# Patient Record
Sex: Male | Born: 1970 | Race: White | Hispanic: No | Marital: Married | Smoking: Former smoker
Health system: Southern US, Community
[De-identification: ages and names within clinical notes are randomized; demographics above are authoritative.]

## PROBLEM LIST (undated history)

## (undated) DIAGNOSIS — F410 Panic disorder [episodic paroxysmal anxiety] without agoraphobia: Secondary | ICD-10-CM

## (undated) DIAGNOSIS — K219 Gastro-esophageal reflux disease without esophagitis: Secondary | ICD-10-CM

## (undated) DIAGNOSIS — E669 Obesity, unspecified: Secondary | ICD-10-CM

## (undated) HISTORY — PX: HAND SURGERY: SHX662

## (undated) HISTORY — PX: NASAL SINUS SURGERY: SHX719

---

## 2000-03-13 ENCOUNTER — Encounter: Payer: Self-pay | Admitting: Otolaryngology

## 2000-03-13 ENCOUNTER — Encounter: Admission: RE | Admit: 2000-03-13 | Discharge: 2000-03-13 | Payer: Self-pay | Admitting: Otolaryngology

## 2000-04-13 ENCOUNTER — Other Ambulatory Visit: Admission: RE | Admit: 2000-04-13 | Discharge: 2000-04-13 | Payer: Self-pay | Admitting: Otolaryngology

## 2009-03-12 ENCOUNTER — Encounter: Admission: RE | Admit: 2009-03-12 | Discharge: 2009-03-12 | Payer: Self-pay | Admitting: Gastroenterology

## 2010-12-26 ENCOUNTER — Encounter: Payer: Self-pay | Admitting: Emergency Medicine

## 2010-12-26 ENCOUNTER — Emergency Department (HOSPITAL_BASED_OUTPATIENT_CLINIC_OR_DEPARTMENT_OTHER)
Admission: EM | Admit: 2010-12-26 | Discharge: 2010-12-26 | Disposition: A | Discharge status: Home / Self Care | Payer: BC Managed Care – PPO | Attending: Emergency Medicine | Admitting: Emergency Medicine

## 2010-12-26 DIAGNOSIS — M25579 Pain in unspecified ankle and joints of unspecified foot: Secondary | ICD-10-CM | POA: Insufficient documentation

## 2010-12-26 DIAGNOSIS — M109 Gout, unspecified: Secondary | ICD-10-CM | POA: Insufficient documentation

## 2010-12-26 HISTORY — DX: Gastro-esophageal reflux disease without esophagitis: K21.9

## 2010-12-26 MED ORDER — PREDNISONE 10 MG PO TABS: 20.0000 mg | ORAL_TABLET | Freq: Every day | ORAL | Status: AC

## 2010-12-26 MED ORDER — OXYCODONE-ACETAMINOPHEN 5-325 MG PO TABS: 1.0000 | ORAL_TABLET | ORAL | Status: AC | PRN

## 2010-12-26 NOTE — ED Notes (Signed)
Pt reports severe flare up of gout symptoms

## 2010-12-26 NOTE — ED Provider Notes (Signed)
History     CSN: [id number] Arrival date & time: No admission date for patient encounter.   First MD Initiated Contact with Patient 12/26/10 0710      No chief complaint on file.   (Consider location/radiation/quality/duration/timing/severity/associated sxs/prior treatment) HPI Patient with history of gout and has had three episodes on the right great toe.  Patient seen by pmd and had uric acid and cochicine f/b allopurinol.  Patient states every time he stops meds symptoms return.  Last prednisone about one month ago.  Symptoms began this time last night at 1030 and complains of severe pain.  Pain stays in right great toe area with redness took hydrocodone without relief.   No past medical history on file.  gout  No past surgical history on file.  No family history on file.  History  Substance Use Topics  . Smoking status: Not on file  . Smokeless tobacco: Not on file  . Alcohol Use: Not on file      Review of Systems  All other systems reviewed and are negative.    Allergies  Review of patient's allergies indicates not on file.  Home Medications  No current outpatient prescriptions on file.  There were no vitals taken for this visit.  Physical Exam  Nursing note and vitals reviewed. Constitutional: He is oriented to person, place, and time. He appears well-developed and well-nourished.  HENT:  Head: Normocephalic and atraumatic.  Eyes: Conjunctivae are normal. Pupils are equal, round, and reactive to light.  Neck: Normal range of motion. Neck supple.  Cardiovascular: Normal rate and regular rhythm.   Pulmonary/Chest: Effort normal and breath sounds normal.  Abdominal: Soft. Bowel sounds are normal.  Musculoskeletal: Normal range of motion.       Right great toe with pain and swelling redness tenderness  Neurological: He is alert and oriented to person, place, and time. He has normal reflexes.  Skin: Skin is warm and dry.  Psychiatric: He has a normal mood  and affect.    ED Course  Procedures (including critical care time)  Labs Reviewed - No data to display No results found.   No diagnosis found.            Hilario Quarry, MD 12/26/10 [phone]

## 2012-05-29 ENCOUNTER — Encounter (HOSPITAL_BASED_OUTPATIENT_CLINIC_OR_DEPARTMENT_OTHER): Payer: Self-pay

## 2012-05-29 ENCOUNTER — Emergency Department (HOSPITAL_BASED_OUTPATIENT_CLINIC_OR_DEPARTMENT_OTHER)
Admission: EM | Admit: 2012-05-29 | Discharge: 2012-05-29 | Disposition: A | Discharge status: Home / Self Care | Payer: BC Managed Care – PPO | Attending: Emergency Medicine | Admitting: Emergency Medicine

## 2012-05-29 ENCOUNTER — Emergency Department (HOSPITAL_BASED_OUTPATIENT_CLINIC_OR_DEPARTMENT_OTHER): Payer: BC Managed Care – PPO

## 2012-05-29 DIAGNOSIS — Z79899 Other long term (current) drug therapy: Secondary | ICD-10-CM | POA: Insufficient documentation

## 2012-05-29 DIAGNOSIS — R51 Headache: Secondary | ICD-10-CM | POA: Insufficient documentation

## 2012-05-29 DIAGNOSIS — E669 Obesity, unspecified: Secondary | ICD-10-CM | POA: Insufficient documentation

## 2012-05-29 DIAGNOSIS — R5383 Other fatigue: Secondary | ICD-10-CM | POA: Insufficient documentation

## 2012-05-29 DIAGNOSIS — R5381 Other malaise: Secondary | ICD-10-CM | POA: Insufficient documentation

## 2012-05-29 DIAGNOSIS — Z8719 Personal history of other diseases of the digestive system: Secondary | ICD-10-CM | POA: Insufficient documentation

## 2012-05-29 DIAGNOSIS — R202 Paresthesia of skin: Secondary | ICD-10-CM

## 2012-05-29 DIAGNOSIS — R209 Unspecified disturbances of skin sensation: Secondary | ICD-10-CM | POA: Insufficient documentation

## 2012-05-29 DIAGNOSIS — M109 Gout, unspecified: Secondary | ICD-10-CM | POA: Insufficient documentation

## 2012-05-29 DIAGNOSIS — Z87891 Personal history of nicotine dependence: Secondary | ICD-10-CM | POA: Insufficient documentation

## 2012-05-29 HISTORY — DX: Obesity, unspecified: E66.9

## 2012-05-29 LAB — COMPREHENSIVE METABOLIC PANEL
AST: 20 U/L (ref 0–37)
Albumin: 4.1 g/dL (ref 3.5–5.2)
BUN: 12 mg/dL (ref 6–23)
Calcium: 9.4 mg/dL (ref 8.4–10.5)
Creatinine, Ser: 1 mg/dL (ref 0.50–1.35)
Total Protein: 6.4 g/dL (ref 6.0–8.3)

## 2012-05-29 LAB — CBC WITH DIFFERENTIAL/PLATELET
Basophils Absolute: 0 10*3/uL (ref 0.0–0.1)
Basophils Relative: 0 % (ref 0–1)
Eosinophils Absolute: 0.3 10*3/uL (ref 0.0–0.7)
HCT: 41.4 % (ref 39.0–52.0)
Hemoglobin: 14.9 g/dL (ref 13.0–17.0)
MCH: 31.6 pg (ref 26.0–34.0)
MCHC: 36 g/dL (ref 30.0–36.0)
Monocytes Absolute: 0.6 10*3/uL (ref 0.1–1.0)
Monocytes Relative: 8 % (ref 3–12)
RDW: 13.3 % (ref 11.5–15.5)

## 2012-05-29 LAB — GLUCOSE, CAPILLARY: Glucose-Capillary: 118 mg/dL — ABNORMAL HIGH (ref 70–99)

## 2012-05-29 LAB — URINALYSIS, ROUTINE W REFLEX MICROSCOPIC
Ketones, ur: NEGATIVE mg/dL
Leukocytes, UA: NEGATIVE
Nitrite: NEGATIVE
Specific Gravity, Urine: 1.009 (ref 1.005–1.030)
pH: 6 (ref 5.0–8.0)

## 2012-05-29 LAB — TROPONIN I: Troponin I: <0.3 ng/mL (ref <0.30)

## 2012-05-29 NOTE — ED Notes (Signed)
Pt states that he began to experience dizziness today while at work about 1630, and a few minutes later he had weakness in the legs.  Pt states that his L arm feels very weak but otherwise symptoms have resolved.  Grips equal, perrla, neuro wnl.

## 2012-05-29 NOTE — ED Provider Notes (Signed)
History  This chart was scribed for Glynn Octave, MD by Bennett Scrape, ED Scribe. This patient was seen in room MH01/MH01 and the patient's care was started at 6:00 PM.   CSN: [id number]  Arrival date & time 05/29/12  1745   First MD Initiated Contact with Patient 05/29/12 1800      Chief Complaint  Patient presents with  . Dizziness    The history is provided by the patient. No language interpreter was used.    Derek Graham is a 42 y.o. male who presents to the Emergency Department complaining of sudden onset, now resolved dizziness with associated 10 minutes of "pins and needles" tingling and weakness in his left hand that radiated up to his left elbow that started while at work 2 hours ago. He states that he feels weak currently and has a mild HA but states that the dizziness and the tingling are completely resolved. He states that he left work and went to an [employer], walked in and felt dizzy. When he walked back to his truck and sat down, he started having the tingling sensation. He reports that he has been under more stress than normal and contributes one episode of tingling last week that resolved on its own to anxiety.  He states that he has a h/o vertigo for which he takes meclizine as needed but denies similarities. He denies CP, SOB, nausea, and emesis as associated symptoms. He denies having a h/o HTN and DM. He has a h/o GERD and gout and is a former smoker.  PCP is Dr. Catha Gosselin  Past Medical History  Diagnosis Date  . Gout   . GERD (gastroesophageal reflux disease)   . Obesity     Past Surgical History  Procedure Laterality Date  . Nasal sinus surgery    . Hand surgery      History reviewed. No pertinent family history.  History  Substance Use Topics  . Smoking status: Former Games developer  . Smokeless tobacco: Not on file  . Alcohol Use: No     Comment: once a month      Review of Systems  A complete 10 system review of systems was obtained  and all systems are negative except as noted in the HPI and PMH.   Allergies  Motrin  Home Medications   Current Outpatient Rx  Name  Route  Sig  Dispense  Refill  . allopurinol (ZYLOPRIM) 100 MG tablet   Oral   Take 100 mg by mouth daily.           . colchicine 0.6 MG tablet   Oral   Take 0.6 mg by mouth daily.             Triage Vitals: BP 147/94  Pulse 77  Temp(Src) 98 F (36.7 C) (Oral)  Resp 16  Ht 6' (1.829 m)  Wt 345 lb (156.491 kg)  BMI 46.78 kg/m2  SpO2 98%  Physical Exam  Nursing note and vitals reviewed. Constitutional: He is oriented to person, place, and time. He appears well-developed and well-nourished. No distress.  HENT:  Head: Normocephalic and atraumatic.  Mouth/Throat: Oropharynx is clear and moist.  Eyes: Conjunctivae and EOM are normal. Pupils are equal, round, and reactive to light.  Visual fields full to confrontation   Neck: Neck supple. No tracheal deviation present.  Cardiovascular: Normal rate and regular rhythm.   No murmur heard. Pulmonary/Chest: Effort normal and breath sounds normal. No respiratory distress.  Abdominal: Soft. There  is no tenderness.  Musculoskeletal: Normal range of motion.  Neurological: He is alert and oriented to person, place, and time.  No ataxia on finger to nose, 5/5 strength throughout, no pronator drift. CN 2-12 intact. Negative Romberg.  Skin: Skin is warm and dry.  Psychiatric: He has a normal mood and affect. His behavior is normal.    ED Course  Procedures (including critical care time)  DIAGNOSTIC STUDIES: Oxygen Saturation is 98% on room air, normal by my interpretation.    COORDINATION OF CARE: 6:11 PM-Discussed treatment plan which includes CT of head, CBC panel, CMP, UA and troponin with pt at bedside and pt agreed to plan.   7:28 PM-Informed pt of radiology and lab work results. Discussed discharge plan with pt and pt agreed to plan. Also advised pt to follow up as needed and pt  agreed.  Labs Reviewed  GLUCOSE, CAPILLARY - Abnormal; Notable for the following:    Glucose-Capillary 118 (*)    All other components within normal limits  COMPREHENSIVE METABOLIC PANEL - Abnormal; Notable for the following:    Potassium 3.4 (*)    Glucose, Bld 108 (*)    All other components within normal limits  CBC WITH DIFFERENTIAL  TROPONIN I  URINALYSIS, ROUTINE W REFLEX MICROSCOPIC   Ct Head Wo Contrast  05/29/2012  *RADIOLOGY REPORT*  Clinical Data: Dizziness.  Frontal headache  CT HEAD WITHOUT CONTRAST  Technique:  Contiguous axial images were obtained from the base of the skull through the vertex without contrast.  Comparison: None  Findings: The brain has a normal appearance without evidence for hemorrhage, infarction, hydrocephalus, or mass lesion.  There is no extra axial fluid collection.  Retention cyst or polyps noted within the right maxillary sinus.  The remaining paranasal sinuses appear clear.  The mastoid air cells are clear.  The skull is intact.  IMPRESSION: 1.  Normal appearance of the brain. 2.  Retention cyst versus polyp within the right maxillary sinus.   Original Report Authenticated By: Signa Kell, M.D.      No diagnosis found.    MDM  Patient presents with episode of left arm tingling associated with dizziness and generalized weakness that lasted about 10 minutes and is now resolved. Had similar episode last week of bilateral upper extremity numbness. Denies any focal weakness, difficulty breathing or swallowing. No difficulty talking. feels back to baseline now other than being generally weak.  ABCD 2 score is 1 based on blood pressure. Do not suspect TIA or CVA  Patient feels improved. He is much more calm. CT scan is negative. Laboratory remarkable. Denies any dizziness, lightheadedness, paresthesias or weakness.  Discussed with the patient that he is very low risk for CVA or TIA. ABCD 2 score is 1 because patient was hypertensive on arrival which  may be which may have been from anxiety. Suspect component of anxiety contributing to paresthesias.Believe he is safe for outpatient followup. Doubt TIA or CVA.    Date: 05/29/2012  Rate: 73  Rhythm: normal sinus rhythm  QRS Axis: normal  Intervals: normal  ST/T Wave abnormalities: normal  Conduction Disutrbances:nonspecific intraventricular conduction delay  Narrative Interpretation:   Old EKG Reviewed: none available   I personally performed the services described in this documentation, which was scribed in my presence. The recorded information has been reviewed and is accurate.         Glynn Octave, MD 05/30/12 1155

## 2012-05-29 NOTE — ED Notes (Signed)
MD at bedside. 

## 2012-05-30 ENCOUNTER — Emergency Department (HOSPITAL_COMMUNITY)
Admission: EM | Admit: 2012-05-30 | Discharge: 2012-05-30 | Disposition: A | Discharge status: Home / Self Care | Payer: BC Managed Care – PPO | Attending: Emergency Medicine | Admitting: Emergency Medicine

## 2012-05-30 ENCOUNTER — Encounter (HOSPITAL_COMMUNITY): Payer: Self-pay | Admitting: Neurology

## 2012-05-30 DIAGNOSIS — M109 Gout, unspecified: Secondary | ICD-10-CM | POA: Insufficient documentation

## 2012-05-30 DIAGNOSIS — Z87891 Personal history of nicotine dependence: Secondary | ICD-10-CM | POA: Insufficient documentation

## 2012-05-30 DIAGNOSIS — F419 Anxiety disorder, unspecified: Secondary | ICD-10-CM

## 2012-05-30 DIAGNOSIS — Z79899 Other long term (current) drug therapy: Secondary | ICD-10-CM | POA: Insufficient documentation

## 2012-05-30 DIAGNOSIS — R110 Nausea: Secondary | ICD-10-CM | POA: Insufficient documentation

## 2012-05-30 DIAGNOSIS — E669 Obesity, unspecified: Secondary | ICD-10-CM | POA: Insufficient documentation

## 2012-05-30 DIAGNOSIS — K219 Gastro-esophageal reflux disease without esophagitis: Secondary | ICD-10-CM | POA: Insufficient documentation

## 2012-05-30 DIAGNOSIS — F411 Generalized anxiety disorder: Secondary | ICD-10-CM | POA: Insufficient documentation

## 2012-05-30 MED ORDER — POTASSIUM CHLORIDE CRYS ER 20 MEQ PO TBCR
40.0000 meq | EXTENDED_RELEASE_TABLET | Freq: Once | ORAL | Status: AC
  Administered 2012-05-30: 40 meq via ORAL
  Filled 2012-05-30: qty 2

## 2012-05-30 NOTE — ED Provider Notes (Signed)
Complains of left hand numbness onset yesterday afternoon which initially felt like "an electric shock" at his left elbow and then he developed numbness in his left hand. He also reports heaviness in both legs for the past 2 weeks, and numbness around his lips. 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 He was seen at [facility] yesterday had lab work and head CT performed remarkable for potassium 3.4 otherwise normal on exam alert Glasgow Coma Score 15 heart regular rate and rhythm lungs clear auscultation abdomen obese nontender all 4 extremities without redness or tenderness neurovascular intact neurologic Glasgow Coma Score 15 gait normal Romberg normal pronator drift normal motor strength 5 over 5 overall DTRs symmetric bilaterally knee jerk ankle jerk biceps patellar bilaterally finger to nose normal heel to shin normal I discussed with patient at length I feel that his symptoms are largely due to anxiety. He is in agreement.  Doug Sou, MD 05/30/12 [phone]

## 2012-05-30 NOTE — ED Notes (Addendum)
Pt reporting heaviness in arms and legs. This has been going on for several weeks, is intermittent and has tingling at times. Reporting significant stress in his life right now. Was seen at [facility] last night, discharged. Last night nausea. Speech is clear, following all commands. A x 4. Reports h/a, dizziness that has been intermittent

## 2012-05-30 NOTE — ED Provider Notes (Signed)
Medical screening examination/treatment/procedure(s) were conducted as a shared visit with non-physician practitioner(s) and myself.  I personally evaluated the patient during the encounter  Latreshia Beauchaine, MD 05/30/12 1740 

## 2012-05-30 NOTE — ED Provider Notes (Signed)
History     CSN: [id number]  Arrival date & time 05/30/12  0741   First MD Initiated Contact with Patient 05/30/12 [phone]      Chief Complaint  Patient presents with  . heaviness     arms and legs     (Consider location/radiation/quality/duration/timing/severity/associated sxs/prior treatment) HPI Pt is a 42yo male c/o left handed heaviness and weakness.  States he was seen yesterday for same complaint including numbness and tingling in both arms and legs.   Had complete workup including head CT, EKG, troponin, CBC, CMP, and UA, which were all negative.  Provider yesterday did mention he could not completely rule out a mini stroke.  Since then, patient has been concerned.  States he feels like this is anxiety but just wants to make sure.  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  Denies cardiac or migraine hx. Denies HTN, states he has been told he's borderline but not on meds.  Denies chest pain, sob, or other symptoms at this time.    Past Medical History  Diagnosis Date  . Gout   . GERD (gastroesophageal reflux disease)   . Obesity     Past Surgical History  Procedure Laterality Date  . Nasal sinus surgery    . Hand surgery      No family history on file.  History  Substance Use Topics  . Smoking status: Former Games developer  . Smokeless tobacco: Not on file  . Alcohol Use: No     Comment: once a month      Review of Systems  Constitutional: Positive for fatigue. Negative for fever, chills and diaphoresis.  Respiratory: Negative for cough, chest tightness and shortness of breath.   Cardiovascular: Negative for chest pain and palpitations.  Gastrointestinal: Positive for nausea. Negative for vomiting.    Allergies  Motrin  Home Medications   Current Outpatient Rx  Name  Route  Sig  Dispense  Refill  . allopurinol (ZYLOPRIM) 100 MG tablet   Oral   Take 100 mg by mouth daily.           . celecoxib (CELEBREX) 200 MG capsule   Oral   Take  200 mg by mouth daily as needed (for pain associated with plantar fasciitis).         . colchicine 0.6 MG tablet   Oral   Take 0.6 mg by mouth daily as needed (for gout).         Marland Kitchen esomeprazole (NEXIUM) 40 MG capsule   Oral   Take 40 mg by mouth daily before breakfast.         . fexofenadine (ALLEGRA) 180 MG tablet   Oral   Take 180 mg by mouth daily.         Marland Kitchen guaiFENesin (MUCINEX) 600 MG 12 hr tablet   Oral   Take 1,200 mg by mouth 2 (two) times daily.           BP 138/84  Pulse 72  Temp(Src) 98.7 F (37.1 C) (Oral)  Resp 17  Ht 6' (1.829 m)  Wt 345 lb (156.491 kg)  BMI 46.78 kg/m2  SpO2 96%  Physical Exam  Nursing note and vitals reviewed. Constitutional: He is oriented to person, place, and time. He appears well-developed and well-nourished. No distress.  Pt sitting upright in exam bed, NAD. Appears well. Speaking quickly but in full sentences.  HENT:  Head: Normocephalic and atraumatic.  Eyes: Conjunctivae and EOM are normal. Pupils are  equal, round, and reactive to light. Right eye exhibits no discharge. Left eye exhibits no discharge. No scleral icterus.  Neck: Normal range of motion. Neck supple. No JVD present. No tracheal deviation present. No thyromegaly present.  Cardiovascular: Normal rate, regular rhythm and normal heart sounds.   Pulmonary/Chest: Effort normal and breath sounds normal. No stridor. No respiratory distress. He has no wheezes. He has no rales. He exhibits no tenderness.  Abdominal: Soft. Bowel sounds are normal. He exhibits no distension. There is no tenderness.  Musculoskeletal: Normal range of motion.  Lymphadenopathy:    He has no cervical adenopathy.  Neurological: He is alert and oriented to person, place, and time. He has normal strength and normal reflexes. He displays no atrophy, no tremor and normal reflexes. No cranial nerve deficit or sensory deficit. He exhibits normal muscle tone. He displays a negative Romberg sign. He  displays no seizure activity. Coordination and gait normal. GCS eye subscore is 4. GCS verbal subscore is 5. GCS motor subscore is 6.  Pt is A&O x4.  CN II-XII in tact. 5/5 grip strength and plantar/dorsiflexion. Neg romberg, nl gait. Nl coordination.   Skin: Skin is warm and dry. He is not diaphoretic.  Psychiatric: His mood appears anxious.    ED Course  Procedures (including critical care time)  Labs Reviewed - No data to display Ct Head Wo Contrast  05/29/2012  *RADIOLOGY REPORT*  Clinical Data: Dizziness.  Frontal headache  CT HEAD WITHOUT CONTRAST  Technique:  Contiguous axial images were obtained from the base of the skull through the vertex without contrast.  Comparison: None  Findings: The brain has a normal appearance without evidence for hemorrhage, infarction, hydrocephalus, or mass lesion.  There is no extra axial fluid collection.  Retention cyst or polyps noted within the right maxillary sinus.  The remaining paranasal sinuses appear clear.  The mastoid air cells are clear.  The skull is intact.  IMPRESSION: 1.  Normal appearance of the brain. 2.  Retention cyst versus polyp within the right maxillary sinus.   Original Report Authenticated By: Signa Kell, M.D.     Date: 05/30/2012  Rate: 71  Rhythm: normal sinus rhythm  QRS Axis: normal  Intervals: normal  ST/T Wave abnormalities: nonspecific ST changes  Conduction Disutrbances:none  Narrative Interpretation:   Old EKG Reviewed: unchanged    1. Anxiety       MDM  Pt c/o left hand weakness and heaviness.  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  Believes symptoms are due to anxiety but concerned after provider yesterday stated he could not r/o mini stroke.    Pt received full w/o for symptoms yesterday at [facility], including head CT, CBC, CMP, UA, and EKG, all negative except mild hypokalemia.    Consulted Dr. Ethelda Chick who spoke with pt.  All agree symptoms are anxiety driven.  Pt is  to f/u with PCP Dr. Catha Gosselin on Tues (06/05/12)     Not concerned for ACS or stroke at this time.  No need for further workup at this time.  Vitals: unremarkable. Discharged in stable condition.    Discussed pt with attending during ED encounter.       Junius Finner, PA-C 05/30/12 [phone]

## 2014-05-16 ENCOUNTER — Other Ambulatory Visit: Payer: Self-pay | Admitting: Family Medicine

## 2014-05-16 DIAGNOSIS — N62 Hypertrophy of breast: Secondary | ICD-10-CM

## 2014-05-16 DIAGNOSIS — N6310 Unspecified lump in the right breast, unspecified quadrant: Secondary | ICD-10-CM

## 2014-05-19 ENCOUNTER — Ambulatory Visit
Admission: RE | Admit: 2014-05-19 | Discharge: 2014-05-19 | Disposition: A | Discharge status: Home / Self Care | Payer: 59 | Source: Ambulatory Visit | Attending: Family Medicine | Admitting: Family Medicine

## 2014-05-19 DIAGNOSIS — N6310 Unspecified lump in the right breast, unspecified quadrant: Secondary | ICD-10-CM

## 2014-05-19 DIAGNOSIS — N62 Hypertrophy of breast: Secondary | ICD-10-CM

## 2015-08-25 DIAGNOSIS — M25511 Pain in right shoulder: Secondary | ICD-10-CM | POA: Diagnosis not present

## 2015-09-02 DIAGNOSIS — M25511 Pain in right shoulder: Secondary | ICD-10-CM | POA: Diagnosis not present

## 2015-09-08 DIAGNOSIS — M19011 Primary osteoarthritis, right shoulder: Secondary | ICD-10-CM | POA: Diagnosis not present

## 2015-09-08 DIAGNOSIS — M25511 Pain in right shoulder: Secondary | ICD-10-CM | POA: Diagnosis not present

## 2015-09-09 DIAGNOSIS — E786 Lipoprotein deficiency: Secondary | ICD-10-CM | POA: Diagnosis not present

## 2015-09-09 DIAGNOSIS — Z79899 Other long term (current) drug therapy: Secondary | ICD-10-CM | POA: Diagnosis not present

## 2015-09-09 DIAGNOSIS — M109 Gout, unspecified: Secondary | ICD-10-CM | POA: Diagnosis not present

## 2015-09-09 DIAGNOSIS — K219 Gastro-esophageal reflux disease without esophagitis: Secondary | ICD-10-CM | POA: Diagnosis not present

## 2015-09-09 DIAGNOSIS — Z125 Encounter for screening for malignant neoplasm of prostate: Secondary | ICD-10-CM | POA: Diagnosis not present

## 2015-11-01 DIAGNOSIS — K219 Gastro-esophageal reflux disease without esophagitis: Secondary | ICD-10-CM | POA: Diagnosis not present

## 2015-11-01 DIAGNOSIS — W19XXXA Unspecified fall, initial encounter: Secondary | ICD-10-CM | POA: Diagnosis not present

## 2015-11-01 DIAGNOSIS — S0181XA Laceration without foreign body of other part of head, initial encounter: Secondary | ICD-10-CM | POA: Diagnosis not present

## 2015-11-01 DIAGNOSIS — M109 Gout, unspecified: Secondary | ICD-10-CM | POA: Diagnosis not present

## 2015-11-01 DIAGNOSIS — E669 Obesity, unspecified: Secondary | ICD-10-CM | POA: Diagnosis not present

## 2015-12-20 DIAGNOSIS — J0100 Acute maxillary sinusitis, unspecified: Secondary | ICD-10-CM | POA: Diagnosis not present

## 2016-02-16 ENCOUNTER — Emergency Department (HOSPITAL_BASED_OUTPATIENT_CLINIC_OR_DEPARTMENT_OTHER)
Admission: EM | Admit: 2016-02-16 | Discharge: 2016-02-17 | Disposition: A | Discharge status: Home / Self Care | Payer: BLUE CROSS/BLUE SHIELD | Attending: Emergency Medicine | Admitting: Emergency Medicine

## 2016-02-16 ENCOUNTER — Encounter (HOSPITAL_BASED_OUTPATIENT_CLINIC_OR_DEPARTMENT_OTHER): Payer: Self-pay

## 2016-02-16 ENCOUNTER — Emergency Department (HOSPITAL_BASED_OUTPATIENT_CLINIC_OR_DEPARTMENT_OTHER): Payer: BLUE CROSS/BLUE SHIELD

## 2016-02-16 DIAGNOSIS — R0609 Other forms of dyspnea: Secondary | ICD-10-CM

## 2016-02-16 DIAGNOSIS — Z87891 Personal history of nicotine dependence: Secondary | ICD-10-CM | POA: Insufficient documentation

## 2016-02-16 DIAGNOSIS — Z79899 Other long term (current) drug therapy: Secondary | ICD-10-CM | POA: Insufficient documentation

## 2016-02-16 DIAGNOSIS — R079 Chest pain, unspecified: Secondary | ICD-10-CM | POA: Diagnosis not present

## 2016-02-16 DIAGNOSIS — R0602 Shortness of breath: Secondary | ICD-10-CM | POA: Diagnosis not present

## 2016-02-16 HISTORY — DX: Panic disorder (episodic paroxysmal anxiety): F41.0

## 2016-02-16 LAB — CBC
HCT: 42.1 % (ref 39.0–52.0)
HEMOGLOBIN: 14.6 g/dL (ref 13.0–17.0)
MCH: 30.4 pg (ref 26.0–34.0)
MCHC: 34.7 g/dL (ref 30.0–36.0)
MCV: 87.7 fL (ref 78.0–100.0)
Platelets: 206 10*3/uL (ref 150–400)
RBC: 4.8 MIL/uL (ref 4.22–5.81)
RDW: 13.5 % (ref 11.5–15.5)
WBC: 6.2 10*3/uL (ref 4.0–10.5)

## 2016-02-16 LAB — COMPREHENSIVE METABOLIC PANEL
ALT: 21 U/L (ref 17–63)
AST: 23 U/L (ref 15–41)
Albumin: 4.3 g/dL (ref 3.5–5.0)
Alkaline Phosphatase: 74 U/L (ref 38–126)
Anion gap: 8 (ref 5–15)
BUN: 16 mg/dL (ref 6–20)
CHLORIDE: 103 mmol/L (ref 101–111)
CO2: 27 mmol/L (ref 22–32)
CREATININE: 1.12 mg/dL (ref 0.61–1.24)
Calcium: 9.2 mg/dL (ref 8.9–10.3)
GFR calc Af Amer: >60 mL/min (ref >60)
GFR calc non Af Amer: >60 mL/min (ref >60)
Glucose, Bld: 103 mg/dL — ABNORMAL HIGH (ref 65–99)
Potassium: 3.9 mmol/L (ref 3.5–5.1)
SODIUM: 138 mmol/L (ref 135–145)
Total Bilirubin: 0.6 mg/dL (ref 0.3–1.2)
Total Protein: 7.2 g/dL (ref 6.5–8.1)

## 2016-02-16 LAB — TROPONIN I

## 2016-02-16 LAB — D-DIMER, QUANTITATIVE: D-Dimer, Quant: <0.27 ug/mL-FEU (ref 0.00–0.50)

## 2016-02-16 NOTE — ED Notes (Signed)
Pt states he was standing to urinate and had a sudden SOB feeling where he felt that he couldn't get a deep breath.  Pt states he became nauseated and diaphoretic with this episode.  Pt denies any symptoms currently.

## 2016-02-16 NOTE — ED Provider Notes (Signed)
[facility] Provider Note   CSN: [id number] Arrival date & time: 02/16/16  2157  By signing my name below, I, Linna DarnerRussell Turner, attest that this documentation has been prepared under the direction and in the presence of physician practitioner, Tilden FossaElizabeth Laterra Lubinski, MD. Electronically Signed: Linna Darnerussell Turner, Scribe. 02/16/2016. 10:57 PM.  History   Chief Complaint Chief Complaint  Patient presents with  . Shortness of Breath    The history is provided by the patient. No language interpreter was used.    HPI Comments: Derek Graham is a 46 y.o. male who presents to the Emergency Department complaining of an episode of SOB which occurred around 930 PM tonight. He states he suddenly felt short of breath while ambulating to the bathroom at home. He states his SOB gradually worsened and eventually resolved after about 10 minutes. He states that while he was short of breath, he had a "hot flash", cold sweats, generalized body tingling, a pressure-like sensation in his chest, and lightheadedness. No LOC. He states all of these symptoms have resolved. He notes he has had intermittent subjective fever/chills, dizziness, and a mild dry cough over the last several days. He notes a h/o panic attacks but states his symptoms tonight did not feel like his typical panic attacks. No personal h/o heart disease but he notes his father had heart disease. No h/o HTN, HLD, or DM. He takes several medications on a regular basis and denies recent new medication changes or missing any of his prescribed doses. He denies back pain, abdominal pain, leg swelling, or any other associated symptoms.  Past Medical History:  Diagnosis Date  . GERD (gastroesophageal reflux disease)   . Gout   . Obesity   . Panic attack     There are no active problems to display for this patient.   Past Surgical History:  Procedure Laterality Date  . HAND SURGERY    . NASAL SINUS SURGERY         Home Medications    Prior  to Admission medications   Medication Sig Start Date End Date Taking? Authorizing Provider  Escitalopram Oxalate (LEXAPRO PO) Take by mouth.   Yes Historical Provider, MD  allopurinol (ZYLOPRIM) 100 MG tablet Take 100 mg by mouth daily.      Historical Provider, MD  celecoxib (CELEBREX) 200 MG capsule Take 200 mg by mouth daily as needed (for pain associated with plantar fasciitis).    Historical Provider, MD  colchicine 0.6 MG tablet Take 0.6 mg by mouth daily as needed (for gout).    Historical Provider, MD  esomeprazole (NEXIUM) 40 MG capsule Take 40 mg by mouth daily before breakfast.    Historical Provider, MD  fexofenadine (ALLEGRA) 180 MG tablet Take 180 mg by mouth daily.    Historical Provider, MD    Family History No family history on file.  Social History Social History  Substance Use Topics  . Smoking status: Former Games developermoker  . Smokeless tobacco: Never Used  . Alcohol use No     Allergies   Motrin [ibuprofen]   Review of Systems Review of Systems  Constitutional: Positive for chills, diaphoresis (resolved) and fever.  Respiratory: Positive for cough and shortness of breath (resolved).   Cardiovascular: Positive for chest pain (chest pressure, resolved). Negative for leg swelling.  Gastrointestinal: Negative for abdominal pain.  Musculoskeletal: Negative for back pain.  Neurological: Positive for dizziness, light-headedness (resolved) and numbness (tingling, resolved). Negative for syncope.  All other systems reviewed and are negative.  Physical Exam Updated Vital Signs BP 126/74 (BP Location: Left Arm)   Pulse 65   Temp 98.6 F (37 C) (Oral)   Resp 15   Ht 6' (1.829 m)   Wt (!) 371 lb (168.3 kg)   SpO2 97%   BMI 50.32 kg/m   Physical Exam  Constitutional: He is oriented to person, place, and time. He appears well-developed and well-nourished.  HENT:  Head: Normocephalic and atraumatic.  Cardiovascular: Normal rate and regular rhythm.   No murmur  heard. Pulmonary/Chest: Effort normal and breath sounds normal. No respiratory distress.  Abdominal: Soft. There is no tenderness. There is no rebound and no guarding.  Musculoskeletal: He exhibits no edema or tenderness.  Neurological: He is alert and oriented to person, place, and time.  Skin: Skin is warm and dry.  Psychiatric: He has a normal mood and affect. His behavior is normal.  Nursing note and vitals reviewed.    ED Treatments / Results  Labs (all labs ordered are listed, but only abnormal results are displayed) Labs Reviewed  COMPREHENSIVE METABOLIC PANEL - Abnormal; Notable for the following:       Result Value   Glucose, Bld 103 (*)    All other components within normal limits  CBC  TROPONIN I  D-DIMER, QUANTITATIVE (NOT AT [facility])  TROPONIN I    EKG  EKG Interpretation  Date/Time:  Tuesday February 16 2016 22:05:53 EST Ventricular Rate:  75 PR Interval:  154 QRS Duration: 116 QT Interval:  416 QTC Calculation: 464 R Axis:   51 Text Interpretation:  Normal sinus rhythm Abnormal ECG No significant change since last tracing Confirmed by FLOYD MD, DANIEL [phone]) on 02/16/2016 10:36:49 PM       Radiology Dg Chest 2 View  Result Date: 02/16/2016 CLINICAL DATA:  Sudden onset chest pain today. Shortness of breath. Tingling and cold sensation over the body. Symptoms have now resided. EXAM: CHEST  2 VIEW COMPARISON:  None. FINDINGS: Shallow inspiration with elevation of the right hemidiaphragm. Normal heart size and pulmonary vascularity. No focal airspace disease or consolidation in the lungs. No blunting of costophrenic angles. No pneumothorax. Mediastinal contours appear intact. Tortuous aorta. IMPRESSION: No active cardiopulmonary disease. Electronically Signed   By: Burman Nieves M.D.   On: 02/16/2016 22:22    Procedures Procedures (including critical care time)  DIAGNOSTIC STUDIES: Oxygen Saturation is 98% on RA, normal by my interpretation.    COORDINATION  OF CARE: 11:05 PM Discussed treatment plan with pt at bedside and pt agreed to plan.  Medications Ordered in ED Medications - No data to display   Initial Impression / Assessment and Plan / ED Course  I have reviewed the triage vital signs and the nursing notes.  Pertinent labs & imaging results that were available during my care of the patient were reviewed by me and considered in my medical decision making (see chart for details).  Clinical Course     Patient here for evaluation of episode of chest pain and shortness of breath. Symptoms have resolved in the emergency department. Current clinical picture is not consistent with ACS, PE, CHF, pneumonia. Question element of reflux versus anxiety. Discussed importance of outpatient follow-up, home care which impressions.  Final Clinical Impressions(s) / ED Diagnoses   Final diagnoses:  Other form of dyspnea    New Prescriptions Discharge Medication List as of 02/17/2016  1:59 AM     I personally performed the services described in this documentation, which was scribed in my  presence. The recorded information has been reviewed and is accurate.    Tilden Fossa, MD 02/17/16 [phone]

## 2016-02-16 NOTE — ED Triage Notes (Signed)
C/o sudden onset of SOB, feeling cold,chest tightness-states hx of panic attack

## 2016-02-17 LAB — TROPONIN I

## 2016-02-17 NOTE — ED Notes (Signed)
Pt verbalizes understanding of d/c instructions and denies any further needs at this time. 

## 2016-07-05 DIAGNOSIS — K219 Gastro-esophageal reflux disease without esophagitis: Secondary | ICD-10-CM | POA: Diagnosis not present

## 2016-07-05 DIAGNOSIS — Z6841 Body Mass Index (BMI) 40.0 and over, adult: Secondary | ICD-10-CM | POA: Diagnosis not present

## 2016-07-21 DIAGNOSIS — Z713 Dietary counseling and surveillance: Secondary | ICD-10-CM | POA: Diagnosis not present

## 2016-08-16 DIAGNOSIS — F54 Psychological and behavioral factors associated with disorders or diseases classified elsewhere: Secondary | ICD-10-CM | POA: Diagnosis not present

## 2016-08-16 DIAGNOSIS — F419 Anxiety disorder, unspecified: Secondary | ICD-10-CM | POA: Diagnosis not present

## 2016-08-16 DIAGNOSIS — Z7189 Other specified counseling: Secondary | ICD-10-CM | POA: Diagnosis not present

## 2016-08-18 DIAGNOSIS — E786 Lipoprotein deficiency: Secondary | ICD-10-CM | POA: Diagnosis not present

## 2016-08-18 DIAGNOSIS — R7301 Impaired fasting glucose: Secondary | ICD-10-CM | POA: Diagnosis not present

## 2016-08-23 DIAGNOSIS — K219 Gastro-esophageal reflux disease without esophagitis: Secondary | ICD-10-CM | POA: Diagnosis not present

## 2016-08-23 DIAGNOSIS — Z6841 Body Mass Index (BMI) 40.0 and over, adult: Secondary | ICD-10-CM | POA: Diagnosis not present

## 2016-09-06 DIAGNOSIS — Z713 Dietary counseling and surveillance: Secondary | ICD-10-CM | POA: Diagnosis not present

## 2016-10-25 DIAGNOSIS — K219 Gastro-esophageal reflux disease without esophagitis: Secondary | ICD-10-CM | POA: Diagnosis not present

## 2016-10-27 DIAGNOSIS — H66001 Acute suppurative otitis media without spontaneous rupture of ear drum, right ear: Secondary | ICD-10-CM | POA: Diagnosis not present

## 2016-10-27 DIAGNOSIS — J329 Chronic sinusitis, unspecified: Secondary | ICD-10-CM | POA: Diagnosis not present

## 2016-11-02 DIAGNOSIS — Z79899 Other long term (current) drug therapy: Secondary | ICD-10-CM | POA: Diagnosis not present

## 2016-11-02 DIAGNOSIS — F419 Anxiety disorder, unspecified: Secondary | ICD-10-CM | POA: Diagnosis not present

## 2016-11-02 DIAGNOSIS — M2550 Pain in unspecified joint: Secondary | ICD-10-CM | POA: Diagnosis not present

## 2016-11-02 DIAGNOSIS — R5383 Other fatigue: Secondary | ICD-10-CM | POA: Diagnosis not present

## 2016-11-02 DIAGNOSIS — Z6841 Body Mass Index (BMI) 40.0 and over, adult: Secondary | ICD-10-CM | POA: Diagnosis not present

## 2016-11-02 DIAGNOSIS — K2950 Unspecified chronic gastritis without bleeding: Secondary | ICD-10-CM | POA: Diagnosis not present

## 2016-11-02 DIAGNOSIS — Z886 Allergy status to analgesic agent status: Secondary | ICD-10-CM | POA: Diagnosis not present

## 2016-11-02 DIAGNOSIS — F410 Panic disorder [episodic paroxysmal anxiety] without agoraphobia: Secondary | ICD-10-CM | POA: Diagnosis not present

## 2016-11-02 DIAGNOSIS — Z01818 Encounter for other preprocedural examination: Secondary | ICD-10-CM | POA: Diagnosis not present

## 2016-11-02 DIAGNOSIS — K2970 Gastritis, unspecified, without bleeding: Secondary | ICD-10-CM | POA: Diagnosis not present

## 2016-11-02 DIAGNOSIS — M549 Dorsalgia, unspecified: Secondary | ICD-10-CM | POA: Diagnosis not present

## 2016-11-02 DIAGNOSIS — M109 Gout, unspecified: Secondary | ICD-10-CM | POA: Diagnosis not present

## 2016-11-02 DIAGNOSIS — K219 Gastro-esophageal reflux disease without esophagitis: Secondary | ICD-10-CM | POA: Diagnosis not present

## 2016-11-02 DIAGNOSIS — K317 Polyp of stomach and duodenum: Secondary | ICD-10-CM | POA: Diagnosis not present

## 2016-11-02 DIAGNOSIS — Z87891 Personal history of nicotine dependence: Secondary | ICD-10-CM | POA: Diagnosis not present

## 2016-11-10 DIAGNOSIS — Z6841 Body Mass Index (BMI) 40.0 and over, adult: Secondary | ICD-10-CM | POA: Diagnosis not present

## 2016-11-11 DIAGNOSIS — M19011 Primary osteoarthritis, right shoulder: Secondary | ICD-10-CM | POA: Diagnosis not present

## 2016-11-18 DIAGNOSIS — K219 Gastro-esophageal reflux disease without esophagitis: Secondary | ICD-10-CM | POA: Diagnosis not present

## 2016-11-24 DIAGNOSIS — Z713 Dietary counseling and surveillance: Secondary | ICD-10-CM | POA: Diagnosis not present

## 2016-11-24 DIAGNOSIS — Z01818 Encounter for other preprocedural examination: Secondary | ICD-10-CM | POA: Diagnosis not present

## 2016-12-07 DIAGNOSIS — Z6841 Body Mass Index (BMI) 40.0 and over, adult: Secondary | ICD-10-CM | POA: Diagnosis not present

## 2016-12-07 DIAGNOSIS — E876 Hypokalemia: Secondary | ICD-10-CM | POA: Diagnosis not present

## 2016-12-07 DIAGNOSIS — Z886 Allergy status to analgesic agent status: Secondary | ICD-10-CM | POA: Diagnosis not present

## 2016-12-07 DIAGNOSIS — Z87891 Personal history of nicotine dependence: Secondary | ICD-10-CM | POA: Diagnosis not present

## 2016-12-07 DIAGNOSIS — M109 Gout, unspecified: Secondary | ICD-10-CM | POA: Diagnosis not present

## 2016-12-07 DIAGNOSIS — K219 Gastro-esophageal reflux disease without esophagitis: Secondary | ICD-10-CM | POA: Diagnosis not present

## 2016-12-07 DIAGNOSIS — Z79899 Other long term (current) drug therapy: Secondary | ICD-10-CM | POA: Diagnosis not present

## 2016-12-07 DIAGNOSIS — F419 Anxiety disorder, unspecified: Secondary | ICD-10-CM | POA: Diagnosis not present

## 2016-12-07 DIAGNOSIS — M1990 Unspecified osteoarthritis, unspecified site: Secondary | ICD-10-CM | POA: Diagnosis not present

## 2016-12-07 DIAGNOSIS — E8881 Metabolic syndrome: Secondary | ICD-10-CM | POA: Diagnosis not present

## 2016-12-21 DIAGNOSIS — Z713 Dietary counseling and surveillance: Secondary | ICD-10-CM | POA: Diagnosis not present

## 2017-05-23 DIAGNOSIS — Z9884 Bariatric surgery status: Secondary | ICD-10-CM | POA: Diagnosis not present

## 2017-05-23 DIAGNOSIS — K912 Postsurgical malabsorption, not elsewhere classified: Secondary | ICD-10-CM | POA: Diagnosis not present

## 2017-06-06 DIAGNOSIS — K912 Postsurgical malabsorption, not elsewhere classified: Secondary | ICD-10-CM | POA: Diagnosis not present

## 2017-06-06 DIAGNOSIS — Z9884 Bariatric surgery status: Secondary | ICD-10-CM | POA: Diagnosis not present

## 2017-11-08 DIAGNOSIS — F419 Anxiety disorder, unspecified: Secondary | ICD-10-CM | POA: Diagnosis not present

## 2017-11-08 DIAGNOSIS — K760 Fatty (change of) liver, not elsewhere classified: Secondary | ICD-10-CM | POA: Diagnosis not present

## 2017-11-08 DIAGNOSIS — E786 Lipoprotein deficiency: Secondary | ICD-10-CM | POA: Diagnosis not present

## 2017-11-08 DIAGNOSIS — Z23 Encounter for immunization: Secondary | ICD-10-CM | POA: Diagnosis not present

## 2017-11-08 DIAGNOSIS — R7301 Impaired fasting glucose: Secondary | ICD-10-CM | POA: Diagnosis not present

## 2018-03-06 DIAGNOSIS — M25511 Pain in right shoulder: Secondary | ICD-10-CM | POA: Diagnosis not present

## 2018-03-06 DIAGNOSIS — M19011 Primary osteoarthritis, right shoulder: Secondary | ICD-10-CM | POA: Diagnosis not present

## 2018-04-05 DIAGNOSIS — K912 Postsurgical malabsorption, not elsewhere classified: Secondary | ICD-10-CM | POA: Diagnosis not present

## 2018-04-05 DIAGNOSIS — Z9884 Bariatric surgery status: Secondary | ICD-10-CM | POA: Diagnosis not present

## 2018-08-17 IMAGING — DX DG CHEST 2V
2 series · 2 of 2 positions shown · non-contrast
Comparison: None.

CLINICAL DATA: Sudden onset chest pain today. Shortness of breath.
Tingling and cold sensation over the body. Symptoms have now
resided.

EXAM:
CHEST  2 VIEW

[chest pa]
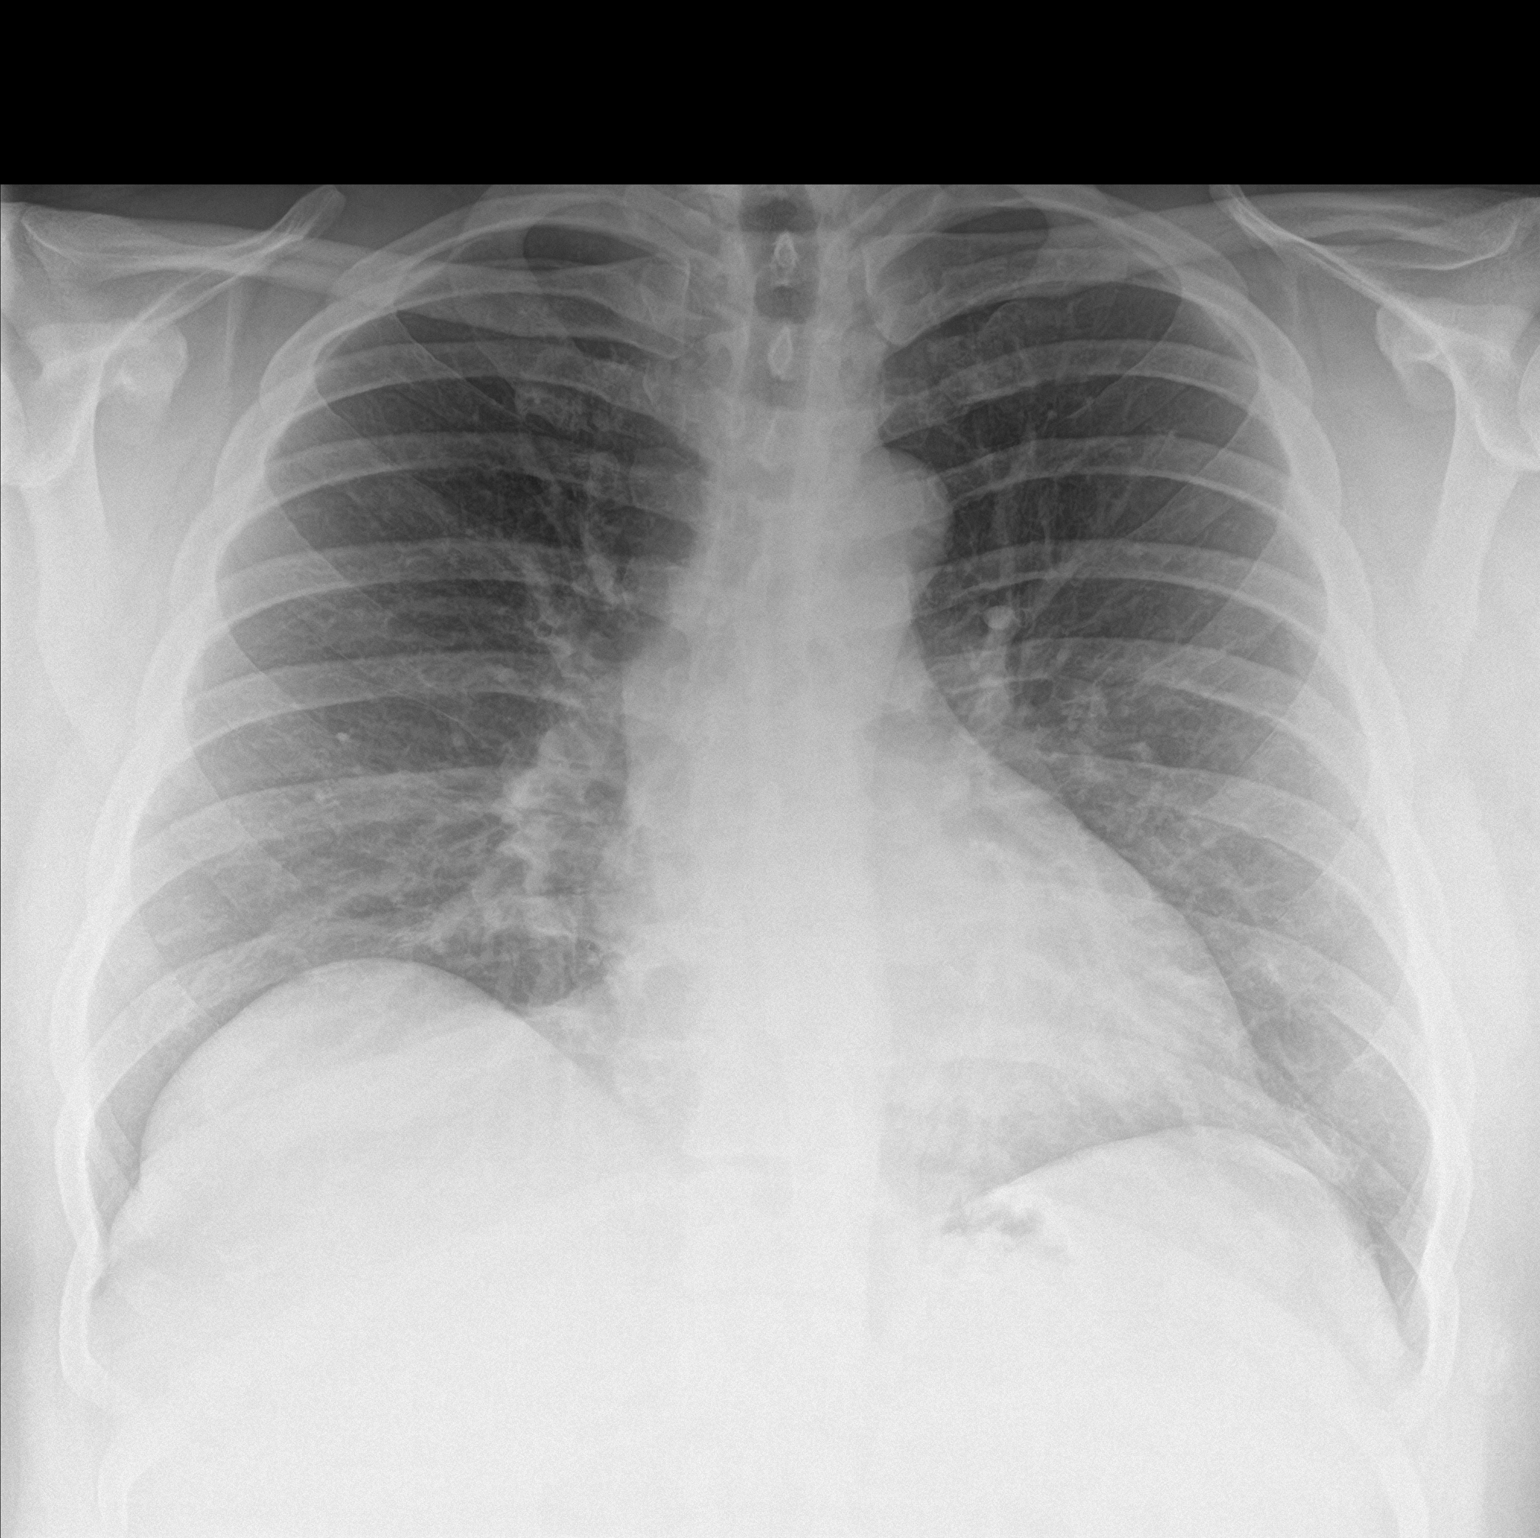

[chest lat]
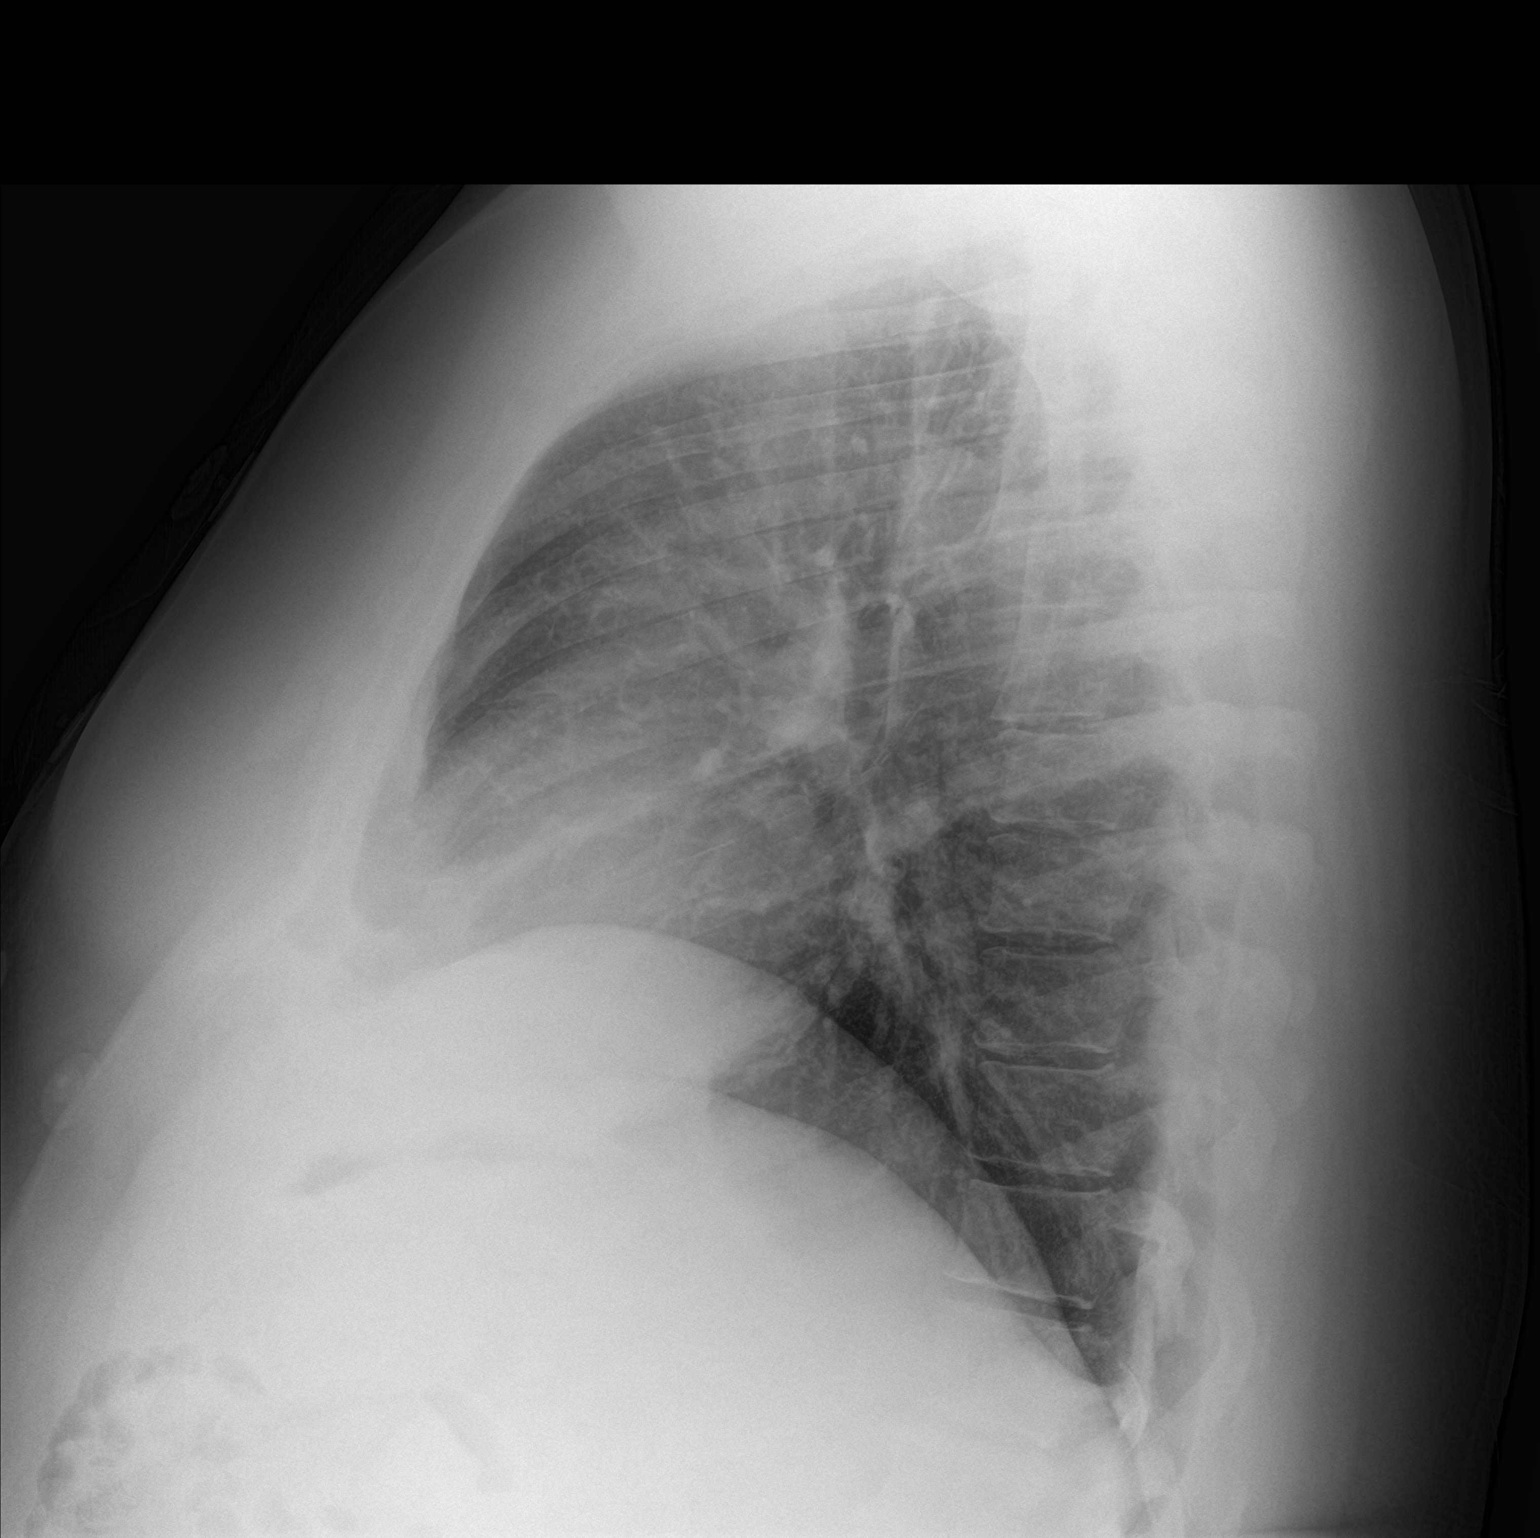

[2 of 2 positions shown; findings below may reference images not displayed]

FINDINGS: Shallow inspiration with elevation of the right hemidiaphragm.
Normal heart size and pulmonary vascularity. No focal airspace
disease or consolidation in the lungs. No blunting of costophrenic
angles. No pneumothorax. Mediastinal contours appear intact.
Tortuous aorta.
IMPRESSION: No active cardiopulmonary disease.

## 2019-02-20 DIAGNOSIS — Z23 Encounter for immunization: Secondary | ICD-10-CM | POA: Diagnosis not present

## 2019-02-20 DIAGNOSIS — E786 Lipoprotein deficiency: Secondary | ICD-10-CM | POA: Diagnosis not present

## 2019-02-20 DIAGNOSIS — Z8739 Personal history of other diseases of the musculoskeletal system and connective tissue: Secondary | ICD-10-CM | POA: Diagnosis not present

## 2019-02-20 DIAGNOSIS — F419 Anxiety disorder, unspecified: Secondary | ICD-10-CM | POA: Diagnosis not present

## 2019-02-20 DIAGNOSIS — K912 Postsurgical malabsorption, not elsewhere classified: Secondary | ICD-10-CM | POA: Diagnosis not present

## 2019-02-20 DIAGNOSIS — R7301 Impaired fasting glucose: Secondary | ICD-10-CM | POA: Diagnosis not present

## 2019-02-20 DIAGNOSIS — Z125 Encounter for screening for malignant neoplasm of prostate: Secondary | ICD-10-CM | POA: Diagnosis not present

## 2019-03-07 DIAGNOSIS — U071 COVID-19: Secondary | ICD-10-CM | POA: Diagnosis not present

## 2019-09-03 DIAGNOSIS — M25511 Pain in right shoulder: Secondary | ICD-10-CM | POA: Diagnosis not present

## 2019-09-03 DIAGNOSIS — M19011 Primary osteoarthritis, right shoulder: Secondary | ICD-10-CM | POA: Diagnosis not present

## 2020-06-02 DIAGNOSIS — Z125 Encounter for screening for malignant neoplasm of prostate: Secondary | ICD-10-CM | POA: Diagnosis not present

## 2020-06-02 DIAGNOSIS — K219 Gastro-esophageal reflux disease without esophagitis: Secondary | ICD-10-CM | POA: Diagnosis not present

## 2020-06-02 DIAGNOSIS — Z0000 Encounter for general adult medical examination without abnormal findings: Secondary | ICD-10-CM | POA: Diagnosis not present

## 2020-06-02 DIAGNOSIS — Z87891 Personal history of nicotine dependence: Secondary | ICD-10-CM | POA: Diagnosis not present

## 2020-06-02 DIAGNOSIS — Z13220 Encounter for screening for lipoid disorders: Secondary | ICD-10-CM | POA: Diagnosis not present

## 2020-06-02 DIAGNOSIS — E559 Vitamin D deficiency, unspecified: Secondary | ICD-10-CM | POA: Diagnosis not present

## 2020-06-30 DIAGNOSIS — D485 Neoplasm of uncertain behavior of skin: Secondary | ICD-10-CM | POA: Diagnosis not present

## 2020-06-30 DIAGNOSIS — Z1283 Encounter for screening for malignant neoplasm of skin: Secondary | ICD-10-CM | POA: Diagnosis not present

## 2020-06-30 DIAGNOSIS — D225 Melanocytic nevi of trunk: Secondary | ICD-10-CM | POA: Diagnosis not present

## 2020-06-30 DIAGNOSIS — Z85820 Personal history of malignant melanoma of skin: Secondary | ICD-10-CM | POA: Diagnosis not present

## 2020-06-30 DIAGNOSIS — Z08 Encounter for follow-up examination after completed treatment for malignant neoplasm: Secondary | ICD-10-CM | POA: Diagnosis not present

## 2020-07-14 DIAGNOSIS — L905 Scar conditions and fibrosis of skin: Secondary | ICD-10-CM | POA: Diagnosis not present

## 2020-07-14 DIAGNOSIS — D485 Neoplasm of uncertain behavior of skin: Secondary | ICD-10-CM | POA: Diagnosis not present

## 2020-07-14 DIAGNOSIS — L988 Other specified disorders of the skin and subcutaneous tissue: Secondary | ICD-10-CM | POA: Diagnosis not present

## 2020-12-16 DIAGNOSIS — Z85820 Personal history of malignant melanoma of skin: Secondary | ICD-10-CM | POA: Diagnosis not present

## 2020-12-16 DIAGNOSIS — D225 Melanocytic nevi of trunk: Secondary | ICD-10-CM | POA: Diagnosis not present

## 2020-12-16 DIAGNOSIS — D487 Neoplasm of uncertain behavior of other specified sites: Secondary | ICD-10-CM | POA: Diagnosis not present

## 2020-12-16 DIAGNOSIS — D2261 Melanocytic nevi of right upper limb, including shoulder: Secondary | ICD-10-CM | POA: Diagnosis not present

## 2020-12-16 DIAGNOSIS — D485 Neoplasm of uncertain behavior of skin: Secondary | ICD-10-CM | POA: Diagnosis not present

## 2020-12-16 DIAGNOSIS — Z08 Encounter for follow-up examination after completed treatment for malignant neoplasm: Secondary | ICD-10-CM | POA: Diagnosis not present

## 2020-12-16 DIAGNOSIS — Z1283 Encounter for screening for malignant neoplasm of skin: Secondary | ICD-10-CM | POA: Diagnosis not present

## 2021-01-01 DIAGNOSIS — S61200A Unspecified open wound of right index finger without damage to nail, initial encounter: Secondary | ICD-10-CM | POA: Diagnosis not present

## 2021-02-16 DIAGNOSIS — D225 Melanocytic nevi of trunk: Secondary | ICD-10-CM | POA: Diagnosis not present

## 2021-02-16 DIAGNOSIS — D485 Neoplasm of uncertain behavior of skin: Secondary | ICD-10-CM | POA: Diagnosis not present

## 2021-05-19 DIAGNOSIS — L988 Other specified disorders of the skin and subcutaneous tissue: Secondary | ICD-10-CM | POA: Diagnosis not present

## 2021-05-19 DIAGNOSIS — D485 Neoplasm of uncertain behavior of skin: Secondary | ICD-10-CM | POA: Diagnosis not present

## 2021-06-03 DIAGNOSIS — Z201 Contact with and (suspected) exposure to tuberculosis: Secondary | ICD-10-CM | POA: Diagnosis not present

## 2021-06-03 DIAGNOSIS — R799 Abnormal finding of blood chemistry, unspecified: Secondary | ICD-10-CM | POA: Diagnosis not present

## 2022-04-06 DIAGNOSIS — M13842 Other specified arthritis, left hand: Secondary | ICD-10-CM | POA: Diagnosis not present

## 2022-04-06 DIAGNOSIS — M25511 Pain in right shoulder: Secondary | ICD-10-CM | POA: Diagnosis not present

## 2022-04-06 DIAGNOSIS — M79645 Pain in left finger(s): Secondary | ICD-10-CM | POA: Diagnosis not present

## 2022-04-13 DIAGNOSIS — Z08 Encounter for follow-up examination after completed treatment for malignant neoplasm: Secondary | ICD-10-CM | POA: Diagnosis not present

## 2022-04-13 DIAGNOSIS — Z85820 Personal history of malignant melanoma of skin: Secondary | ICD-10-CM | POA: Diagnosis not present

## 2022-04-13 DIAGNOSIS — D485 Neoplasm of uncertain behavior of skin: Secondary | ICD-10-CM | POA: Diagnosis not present

## 2022-04-13 DIAGNOSIS — D225 Melanocytic nevi of trunk: Secondary | ICD-10-CM | POA: Diagnosis not present

## 2022-04-13 DIAGNOSIS — Z1283 Encounter for screening for malignant neoplasm of skin: Secondary | ICD-10-CM | POA: Diagnosis not present

## 2022-04-13 DIAGNOSIS — M13842 Other specified arthritis, left hand: Secondary | ICD-10-CM | POA: Diagnosis not present

## 2022-04-20 DIAGNOSIS — M19011 Primary osteoarthritis, right shoulder: Secondary | ICD-10-CM | POA: Diagnosis not present

## 2022-04-20 DIAGNOSIS — J3089 Other allergic rhinitis: Secondary | ICD-10-CM | POA: Diagnosis not present

## 2022-05-25 DIAGNOSIS — Z83719 Family history of colon polyps, unspecified: Secondary | ICD-10-CM | POA: Diagnosis not present

## 2022-05-25 DIAGNOSIS — K635 Polyp of colon: Secondary | ICD-10-CM | POA: Diagnosis not present

## 2022-05-25 DIAGNOSIS — D125 Benign neoplasm of sigmoid colon: Secondary | ICD-10-CM | POA: Diagnosis not present

## 2022-05-25 DIAGNOSIS — Z1211 Encounter for screening for malignant neoplasm of colon: Secondary | ICD-10-CM | POA: Diagnosis not present

## 2022-07-14 DIAGNOSIS — M1000 Idiopathic gout, unspecified site: Secondary | ICD-10-CM | POA: Diagnosis not present

## 2022-07-14 DIAGNOSIS — F411 Generalized anxiety disorder: Secondary | ICD-10-CM | POA: Diagnosis not present

## 2022-12-09 DIAGNOSIS — R051 Acute cough: Secondary | ICD-10-CM | POA: Diagnosis not present

## 2022-12-09 DIAGNOSIS — J0100 Acute maxillary sinusitis, unspecified: Secondary | ICD-10-CM | POA: Diagnosis not present

## 2023-02-22 DIAGNOSIS — M1000 Idiopathic gout, unspecified site: Secondary | ICD-10-CM | POA: Diagnosis not present

## 2023-02-22 DIAGNOSIS — M62838 Other muscle spasm: Secondary | ICD-10-CM | POA: Diagnosis not present

## 2023-02-22 DIAGNOSIS — F411 Generalized anxiety disorder: Secondary | ICD-10-CM | POA: Diagnosis not present

## 2023-02-22 DIAGNOSIS — E782 Mixed hyperlipidemia: Secondary | ICD-10-CM | POA: Diagnosis not present

## 2023-07-25 DIAGNOSIS — R1110 Vomiting, unspecified: Secondary | ICD-10-CM | POA: Diagnosis not present

## 2023-08-30 DIAGNOSIS — F411 Generalized anxiety disorder: Secondary | ICD-10-CM | POA: Diagnosis not present

## 2023-11-27 DIAGNOSIS — R1032 Left lower quadrant pain: Secondary | ICD-10-CM | POA: Diagnosis not present

## 2023-11-28 DIAGNOSIS — R1032 Left lower quadrant pain: Secondary | ICD-10-CM | POA: Diagnosis not present

## 2023-11-28 DIAGNOSIS — N433 Hydrocele, unspecified: Secondary | ICD-10-CM | POA: Diagnosis not present

## 2023-11-28 DIAGNOSIS — K4090 Unilateral inguinal hernia, without obstruction or gangrene, not specified as recurrent: Secondary | ICD-10-CM | POA: Diagnosis not present

## 2023-11-30 DIAGNOSIS — K5900 Constipation, unspecified: Secondary | ICD-10-CM | POA: Diagnosis not present

## 2023-11-30 DIAGNOSIS — K4090 Unilateral inguinal hernia, without obstruction or gangrene, not specified as recurrent: Secondary | ICD-10-CM | POA: Diagnosis not present

## 2024-01-08 DIAGNOSIS — J180 Bronchopneumonia, unspecified organism: Secondary | ICD-10-CM | POA: Diagnosis not present

## 2024-01-08 DIAGNOSIS — R051 Acute cough: Secondary | ICD-10-CM | POA: Diagnosis not present
